# Patient Record
Sex: Female | Born: 1995 | Race: White | Hispanic: No | Marital: Single | Smoking: Current every day smoker
Health system: Southern US, Community
[De-identification: ages and names within clinical notes are randomized; demographics above are authoritative.]

## PROBLEM LIST (undated history)

## (undated) DIAGNOSIS — F419 Anxiety disorder, unspecified: Secondary | ICD-10-CM

## (undated) DIAGNOSIS — S82899A Other fracture of unspecified lower leg, initial encounter for closed fracture: Secondary | ICD-10-CM

## (undated) DIAGNOSIS — F329 Major depressive disorder, single episode, unspecified: Secondary | ICD-10-CM

## (undated) DIAGNOSIS — F32A Depression, unspecified: Secondary | ICD-10-CM

## (undated) HISTORY — DX: Depression, unspecified: F32.A

## (undated) HISTORY — DX: Anxiety disorder, unspecified: F41.9

## (undated) HISTORY — DX: Major depressive disorder, single episode, unspecified: F32.9

## (undated) HISTORY — PX: TONSILLECTOMY: SUR1361

## (undated) HISTORY — PX: OTHER SURGICAL HISTORY: SHX169

## (undated) HISTORY — PX: INNER EAR SURGERY: SHX679

---
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
## 2016-10-06 ENCOUNTER — Ambulatory Visit (INDEPENDENT_AMBULATORY_CARE_PROVIDER_SITE_OTHER): Payer: 59 | Admitting: Physician Assistant

## 2016-10-06 ENCOUNTER — Encounter: Payer: Self-pay | Admitting: Physician Assistant

## 2016-10-06 VITALS — BP 120/73 | HR 97 | Temp 99.1°F | Resp 16 | Ht 61.5 in | Wt 169.2 lb

## 2016-10-06 DIAGNOSIS — F411 Generalized anxiety disorder: Secondary | ICD-10-CM

## 2016-10-06 DIAGNOSIS — R109 Unspecified abdominal pain: Secondary | ICD-10-CM | POA: Diagnosis not present

## 2016-10-06 DIAGNOSIS — G43A0 Cyclical vomiting, not intractable: Secondary | ICD-10-CM

## 2016-10-06 DIAGNOSIS — R112 Nausea with vomiting, unspecified: Secondary | ICD-10-CM | POA: Diagnosis not present

## 2016-10-06 LAB — POCT URINALYSIS DIP (MANUAL ENTRY)
Bilirubin, UA: NEGATIVE
Blood, UA: NEGATIVE
Glucose, UA: NEGATIVE mg/dL
Ketones, POC UA: NEGATIVE mg/dL
Leukocytes, UA: NEGATIVE
Nitrite, UA: NEGATIVE
Protein Ur, POC: NEGATIVE mg/dL
Spec Grav, UA: 1.025 (ref 1.010–1.025)
Urobilinogen, UA: 0.2 E.U./dL
pH, UA: 5.5 (ref 5.0–8.0)

## 2016-10-06 LAB — POCT URINE PREGNANCY: Preg Test, Ur: NEGATIVE

## 2016-10-06 LAB — POCT CBC
Granulocyte percent: 68 %G (ref 37–80)
HCT, POC: 42.6 % (ref 37.7–47.9)
Hemoglobin: 14.4 g/dL (ref 12.2–16.2)
Lymph, poc: 2.8 (ref 0.6–3.4)
MCH, POC: 29 pg (ref 27–31.2)
MCHC: 33.8 g/dL (ref 31.8–35.4)
MCV: 85.9 fL (ref 80–97)
MID (cbc): 0.6 (ref 0–0.9)
MPV: 7.9 fL (ref 0–99.8)
POC Granulocyte: 7.1 — AB (ref 2–6.9)
POC LYMPH PERCENT: 26.5 % (ref 10–50)
POC MID %: 5.5 %M (ref 0–12)
Platelet Count, POC: 429 10*3/uL — AB (ref 142–424)
RBC: 4.96 M/uL (ref 4.04–5.48)
RDW, POC: 12.8 %
WBC: 10.4 10*3/uL — AB (ref 4.6–10.2)

## 2016-10-06 LAB — GLUCOSE, POCT (MANUAL RESULT ENTRY): POC Glucose: 94 mg/dL (ref 70–99)

## 2016-10-06 MED ORDER — ONDANSETRON HCL 4 MG PO TABS: 4.0000 mg | ORAL_TABLET | Freq: Three times a day (TID) | ORAL | 3 refills | Status: DC | PRN

## 2016-10-06 MED ORDER — CAPSAICIN 0.025 % EX CREA: TOPICAL_CREAM | Freq: Every day | CUTANEOUS | 0 refills | Status: DC

## 2016-10-06 NOTE — Progress Notes (Signed)
Alicia Lester  MRN: [id number] DOB: 1995-06-01  PCP: Patient, No Pcp Per  Subjective:  Pt is a 21 year old female who presents to clinic for nausea, vomiting, diarrhea x 4 days. She works as an Cabin [employer].  Nausea and vomiting x 3 days. Vomiting occurs in the morning. She feels nauseous for the rest of the day. 3-4 episodes of diarrhea/day Endorses lower abdominal pain "it always a present pain" and it can get really bad. Abdominal pain x 1 years. No association with food. nausea is worse with laying on her back. She has a few months or so without symptoms, then may vomit daily in the a.m x several days. She has been evaluated by GI multiple times. Flex sig endoscopy was negative. Colonoscopy showed hemorrhoids.  She smokes marijuana every evening. Heat makes nausea worse.  She drinks alcohol occasionally.  She eats mostly boxed meals high in salt, apples. She does not like a lot of vegetables. Will eat corn and peas. Does not consume a lot of sugar. Drinks mostly water, very little soda.   H/o IBS - controlled.  She is taking OCPs. Lives with boyfriend. Has sex without condoms.  2016 went to [place]. Her symptoms started in 2017.  Denies weight loss, mucus in stool, blood in stool, blood in vomit,   H/o anxiety and depression. She is not taking Lexapro on a regular basis x 2 years due to finances.    She still has her gallbladder.   Review of Systems  Constitutional: Negative for chills, diaphoresis, fatigue and fever.  HENT: Negative for congestion, postnasal drip, rhinorrhea, sinus pressure, sneezing and sore throat.   Respiratory: Negative for cough, chest tightness, shortness of breath and wheezing.   Cardiovascular: Negative for chest pain and palpitations.  Gastrointestinal: Positive for abdominal pain, diarrhea, nausea and vomiting. Negative for blood in stool and constipation.  Genitourinary: Negative for decreased urine volume, difficulty urinating, dysuria, enuresis,  flank pain, frequency, hematuria and urgency.  Musculoskeletal: Negative for back pain.  Neurological: Negative for dizziness, weakness, light-headedness and headaches.  Psychiatric/Behavioral: Positive for dysphoric mood. The patient is nervous/anxious.     There are no active problems to display for this patient.   Current Outpatient Prescriptions on File Prior to Visit  Medication Sig Dispense Refill  . escitalopram (LEXAPRO) 10 MG tablet Take 10 mg by mouth daily.    Marland Kitchen amitriptyline (ELAVIL) 25 MG tablet Take 25 mg by mouth at bedtime as needed for sleep.    Marland Kitchen etonogestrel (NEXPLANON) 68 MG IMPL implant 1 each by Subdermal route continuous.    Marland Kitchen FLUoxetine (PROZAC) 10 MG tablet Take 1 tablet (10 mg total) by mouth daily. (Patient not taking: Reported on 05/16/2014) 30 tablet 1  . HYDROcodone-acetaminophen (NORCO/VICODIN) 5-325 MG per tablet Take 1 tablet by mouth every 4 (four) hours as needed for moderate pain or severe pain. (Patient not taking: Reported on 05/16/2014) 11 tablet 0  . ibuprofen (ADVIL,MOTRIN) 600 MG tablet Take 1 tablet (600 mg total) by mouth 3 (three) times daily. (Patient not taking: Reported on 10/06/2016) 30 tablet 0  . loperamide (IMODIUM) 2 MG capsule Take 2 mg by mouth 4 (four) times daily as needed for diarrhea or loose stools.     No current facility-administered medications on file prior to visit.     No Known Allergies   Objective:  BP 120/73   Pulse 97   Temp 99.1 F (37.3 C) (Oral)   Resp 16   Ht 5'  1.5" (1.562 m)   Wt 169 lb 3.2 oz (76.7 kg)   SpO2 95%   BMI 31.45 kg/m   Physical Exam  Constitutional: She is oriented to person, place, and time and well-developed, well-nourished, and in no distress. No distress.  Cardiovascular: Normal rate, regular rhythm and normal heart sounds.   Abdominal: Soft. Normal appearance and bowel sounds are normal. There is tenderness in the right lower quadrant and epigastric area. There is no rigidity, no rebound,  no guarding and no tenderness at McBurney's point.  Neurological: She is alert and oriented to person, place, and time. GCS score is 15.  Skin: Skin is warm and dry.  Psychiatric: Mood, memory, affect and judgment normal.  Vitals reviewed.  Results for orders placed or performed in visit on 10/06/16  POCT urine pregnancy  Result Value Ref Range   Preg Test, Ur Negative Negative  POCT urinalysis dipstick  Result Value Ref Range   Color, UA yellow yellow   Clarity, UA clear clear   Glucose, UA negative negative mg/dL   Bilirubin, UA negative negative   Ketones, POC UA negative negative mg/dL   Spec Grav, UA 1.025 1.010 - 1.025   Blood, UA negative negative   pH, UA 5.5 5.0 - 8.0   Protein Ur, POC negative negative mg/dL   Urobilinogen, UA 0.2 0.2 or 1.0 E.U./dL   Nitrite, UA Negative Negative   Leukocytes, UA Negative Negative  POCT CBC  Result Value Ref Range   WBC 10.4 (A) 4.6 - 10.2 K/uL   Lymph, poc 2.8 0.6 - 3.4   POC LYMPH PERCENT 26.5 10 - 50 %L   MID (cbc) 0.6 0 - 0.9   POC MID % 5.5 0 - 12 %M   POC Granulocyte 7.1 (A) 2 - 6.9   Granulocyte percent 68.0 37 - 80 %G   RBC 4.96 4.04 - 5.48 M/uL   Hemoglobin 14.4 12.2 - 16.2 g/dL   HCT, POC 42.6 37.7 - 47.9 %   MCV 85.9 80 - 97 fL   MCH, POC 29.0 27 - 31.2 pg   MCHC 33.8 31.8 - 35.4 g/dL   RDW, POC 12.8 %   Platelet Count, POC 429 (A) 142 - 424 K/uL   MPV 7.9 0 - 99.8 fL  POCT glucose (manual entry)  Result Value Ref Range   POC Glucose 94 70 - 99 mg/dl    Assessment and Plan :  1. 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 Nausea and vomiting, intractability of vomiting not specified, unspecified vomiting type 3. Abdominal pain, unspecified abdominal location 4. Generalized anxiety disorder - capsaicin (ZOSTRIX) 0.025 % cream; Apply topically daily. Apply a thin film over the abdomen once - may improve acute severe abdominal pain and emesis  Dispense: 60 g; Refill: 0 -  ondansetron (ZOFRAN) 4 MG tablet; Take 1 tablet (4 mg total) by mouth every 8 (eight) hours as needed for nausea or vomiting. 1-2 tablets  Dispense: 30 tablet; Refill: 3 - POCT urine pregnancy - POCT urinalysis dipstick - POCT CBC - POCT glucose (manual entry) - CMP14+EGFR - H. pylori breath test - Pt has interesting HPI. >1 year of cyclical morning vomiting. She has been evaluated by several specialists multiple times, which were negative. Medical records show multiple [facility] admits >2 years ago.  POCT labs are wnl. H Pylori is pending. Suspect GAD vs cannabis hyperemesis syndrome vs GERD vs cyclical vomiting syndrome. Advised pt to start lifestyle changes: start taking  Lexapro daily, take multivitamin, eat several small healthy meals daily, stop smoking marijuana. RTC in 4-6 weeks to check in. Consider Reglan, starting exercise plan, referral to acupuncture.   Mercer Pod, PA-C  [facility] at [place] 10/06/2016 3:46 PM

## 2016-10-06 NOTE — Patient Instructions (Addendum)
There are a plethora of factors that can be causing your cyclical vomiting episodes. Since you have already had extensive work-ups from several specialists, I'd like you to start making changes to your lifestyle.    1) Start taking Lexapro on a regular daily basis. It is important to control your anxiety. Studies show that 20% of patients with cyclical vomiting syndrome have a psychiatric diagnosis (either anxiety, panic anxiety, or another affective disorder).  2) Start taking a B vitamin daily, especially containing B6.   3) Eat healthy foods and drink on a regular basis: Fluids should be consumed at least 30 minutes before or after solid food to minimize the effect of a full stomach. Women with nausea should eat before, or as soon as, they feel hungry to avoid an empty stomach, which can aggravate nausea. A snack before getting out of bed in the morning and snacks during the night (eg, crackers with peanut butter or cheese taken prior to bathroom trips) may be helpful. Meals and snacks should be eaten slowly and in small amounts every one to two hours to avoid an overly full stomach, which can also aggravate nausea for some women. Foods high in sugar may exacerbate symptoms.  4) Stop smoking marijuana for at least 2-3 weeks. There is a condition called cannabis hyperemesis syndrome which is associated with chronic cannabis use.Cessation of prolonged cannabis use has been associated with resolution of episodic vomiting in case reports suggesting a causal link. Many adults with CVS self-medicate with cannabis to alleviate their daily nausea, and this can be a source of diagnostic confusion. At present, the distinction between CVS and cannabis hyperemesis syndrome (R[place] IV criteria) is unclear as there is definite phenotypic overlap between the two conditions.   Come back and see me in 4-6 weeks to check in.   Thank you for coming in today. I hope you feel we met your needs.  Feel free to call PCP if you  have any questions or further requests.  Please consider signing up for MyChart if you do not already have it, as this is a great way to communicate with me.  Best,  [employer], PA-C

## 2016-10-07 ENCOUNTER — Ambulatory Visit: Payer: Self-pay | Admitting: Physician Assistant

## 2016-10-07 ENCOUNTER — Telehealth: Payer: Self-pay | Admitting: Physician Assistant

## 2016-10-07 LAB — CMP14+EGFR
ALT: 17 IU/L (ref 0–32)
AST: 16 IU/L (ref 0–40)
Albumin/Globulin Ratio: 1.6 (ref 1.2–2.2)
Albumin: 4.5 g/dL (ref 3.5–5.5)
Alkaline Phosphatase: 24 IU/L — ABNORMAL LOW (ref 39–117)
BUN/Creatinine Ratio: 14 (ref 9–23)
BUN: 11 mg/dL (ref 6–20)
Bilirubin Total: 0.2 mg/dL (ref 0.0–1.2)
CO2: 22 mmol/L (ref 20–29)
Calcium: 10.2 mg/dL (ref 8.7–10.2)
Chloride: 102 mmol/L (ref 96–106)
Creatinine, Ser: 0.79 mg/dL (ref 0.57–1.00)
GFR calc Af Amer: 125 mL/min/{1.73_m2} (ref >59)
GFR calc non Af Amer: 108 mL/min/{1.73_m2} (ref >59)
Globulin, Total: 2.8 g/dL (ref 1.5–4.5)
Glucose: 105 mg/dL — ABNORMAL HIGH (ref 65–99)
Potassium: 4.6 mmol/L (ref 3.5–5.2)
Sodium: 142 mmol/L (ref 134–144)
Total Protein: 7.3 g/dL (ref 6.0–8.5)

## 2016-10-07 NOTE — Telephone Encounter (Signed)
Pt called stating that  capsaicin (ZOSTRIX) 0.025 % cream   caused skin irritation/ PT was advised to stop using rx and come in due to adverse rx. Pt states she was not experiencing nausea and would hold off on coming in.   Please call to check on pt [phone]

## 2016-10-10 LAB — H. PYLORI BREATH TEST: H pylori Breath Test: NEGATIVE

## 2016-10-10 NOTE — Progress Notes (Signed)
Please call pt and let her know her H Pylori (bacteria in her stomach) is negative. Her liver, kidneys and salts in her blood look great. Please schedule a follow-up appointment with me in 4 weeks.  Thank you!

## 2016-11-09 ENCOUNTER — Encounter: Payer: Self-pay | Admitting: Physician Assistant

## 2016-11-09 ENCOUNTER — Ambulatory Visit (INDEPENDENT_AMBULATORY_CARE_PROVIDER_SITE_OTHER): Payer: 59

## 2016-11-09 ENCOUNTER — Ambulatory Visit (INDEPENDENT_AMBULATORY_CARE_PROVIDER_SITE_OTHER): Payer: 59 | Admitting: Physician Assistant

## 2016-11-09 VITALS — BP 119/73 | HR 95 | Temp 98.9°F | Resp 18 | Ht 61.42 in | Wt 168.2 lb

## 2016-11-09 DIAGNOSIS — R1032 Left lower quadrant pain: Secondary | ICD-10-CM | POA: Diagnosis not present

## 2016-11-09 DIAGNOSIS — Z23 Encounter for immunization: Secondary | ICD-10-CM

## 2016-11-09 DIAGNOSIS — F339 Major depressive disorder, recurrent, unspecified: Secondary | ICD-10-CM | POA: Diagnosis not present

## 2016-11-09 DIAGNOSIS — K5900 Constipation, unspecified: Secondary | ICD-10-CM

## 2016-11-09 MED ORDER — POLYETHYLENE GLYCOL 3350 17 GM/SCOOP PO POWD: 17.0000 g | Freq: Two times a day (BID) | ORAL | 1 refills | Status: DC | PRN

## 2016-11-09 NOTE — Patient Instructions (Addendum)
- Lexapro: 26m x 2 weeks. 510mx 2 weeks. Stop.   For constipation  - Select option 1-3 below to relieve yourself of your stool burden. Then stick with option 1 (or 17 g of powder dissolved in 8 ounces of water once daily and titrate up or down (to a maximum of 34 g daily) to effect.) for the next several weeks. Come back and see me in 4 weeks.    - Get a stool (6-9 inches high) and put it on the floor of your bathroom in front of the toilet. Place your feet on the stool when you plan to have a bowel movement. (google [employer]).  1) Water: Make sure you are drinking enough water daily - about 1-3 liters. 2) Fiber: Make sure you are getting enough fiber in your diet - this will make you regular - you can eat high fiber foods or use metamucil as a supplement - it is really important to drink enough water when using fiber supplements. Foods that have a lot of fiber include vegetables, fruits, beans, nuts, oatmeal, and some breads and cereals. You can tell how much fiber is in a food by reading the nutrition label. Doctors recommend eating 25 to 36 grams of fiber each day. 3) Fitness: Increasing your physical activity will help increase the natural movement of your bowels. Try to get 20-30 minutes of exercise daily.   If your stools are hard or are formed balls or you have to strain a stool softener will help - use colace 2-3 capsule a day  Option 1) For gentle treatment of constipation Use Miralax 1-2 capfuls a day until your stools are soft and regular and then decrease the usage - you can use this daily  Option 2) For more aggressive treatment of constipation Use 4 capfuls of Colace and 6 doses of Miralax and drink it in 2 hours - this should result in several watery stools - if it does not repeat the next day and then go to daily miralax for a week to make sure your bowels are clean and retrained to work properly  Option 3) For the most aggressive treatment of constipation Use 14  capfuls of Miralax in 1 gallon of fluid (gatoraid or water work well or a combination of the two) and drink over 12h - it is ok to eat during this time and then use Miralax 1 capful daily for about 2 weeks to prevent the constipation from returning  Thank you for coming in today. I hope you feel we met your needs.  Feel free to call PCP if you have any questions or further requests.  Please consider signing up for MyChart if you do not already have it, as this is a great way to communicate with me.  Best,  Whitney McVey, PA-C   IF you received an x-ray today, you will receive an invoice from [facility]. Please contact [facility] at [phone] questions or concerns regarding your invoice.   IF you received labwork today, you will receive an invoice from [facility] at [phone] questions or concerns regarding your invoice.   Our billing staff will not be able to assist you with questions regarding bills from these companies.  You will be contacted with the lab results as soon as they are available. The fastest way to get your results is to activate your My Chart account. Instructions are located on the last page of this paperwork. If you  have not heard from [place] regarding the results in 2 weeks, please contact this office.

## 2016-11-09 NOTE — Progress Notes (Signed)
Alicia CostainSavannah Lester  MRN: [id number] DOB: August 28, 1995  PCP: Patient, No Pcp Per  Subjective:  Pt is a 21 year old female who presents to clinic for f/u abdominal pain.  Endorses nausea and diarrhea x 1 day. No blood, fever or chills.   She was here 7/26 for the same c/c. Plan: Pt has interesting HPI. >1 year of cyclical morning vomiting. She has been evaluated by several specialists multiple times, which were negative. [facility].  POCT labs are wnl. H Pylori is pending. Suspect GAD vs cannabis hyperemesis syndrome vs GERD vs cyclical vomiting syndrome. Advised pt to start lifestyle changes: start taking Lexapro daily, take multivitamin, eat several small healthy meals daily, stop smoking marijuana. RTC in 4-6 weeks to check in. Consider Reglan, starting exercise plan, referral to acupuncture.  Today she c/o abdominal pain of her lower stomach "sharp pain goes through my stomach" and gets dizzy.  She stopped smoking marijuana x 2 weeks. Did not help her nausea.  She is now eating chicken or Mal[place]key rather than beef or pork. She has not noticed a difference in how she feels.   H/o IBS - controlled.   C/o diarrhea today. 3 episodes today. Denies blood in her stool. No BM yesterday. Endorses having to strain with bowel movements.   She started Lexapro last month. "I don't think the Lexapro does anything for me".  Denies SI or HI.   Review of Systems  Constitutional: Negative for chills, diaphoresis, fatigue and fever.  Cardiovascular: Negative for chest pain and palpitations.  Gastrointestinal: Positive for abdominal pain, constipation, diarrhea and nausea. Negative for abdominal distention, blood in stool and vomiting.  Genitourinary: Negative for decreased urine volume, difficulty urinating, dysuria, enuresis, flank pain, frequency, hematuria, menstrual problem, pelvic pain, urgency, vaginal bleeding, vaginal discharge and vaginal pain.    Musculoskeletal: Negative for back pain.  Neurological: Negative for dizziness, weakness, light-headedness and headaches.    There are no active problems to display for this patient.   Current Outpatient Prescriptions on File Prior to Visit  Medication Sig Dispense Refill  . escitalopram (LEXAPRO) 10 MG tablet Take 10 mg by mouth daily.    . metFORMIN (GLUCOPHAGE) 500 MG tablet Take by mouth 2 (two) times daily with a meal.    . ondansetron (ZOFRAN) 4 MG tablet Take 1 tablet (4 mg total) by mouth every 8 (eight) hours as needed for nausea or vomiting. 1-2 tablets 30 tablet 3  . PRESCRIPTION MEDICATION     . PRESCRIPTION MEDICATION Birth control pill    . capsaicin (ZOSTRIX) 0.025 % cream Apply topically daily. Apply a thin film over the abdomen once - may improve acute severe abdominal pain and emesis (Patient not taking: Reported on 11/09/2016) 60 g 0   No current facility-administered medications on file prior to visit.     No Known Allergies   Objective:  BP 119/73 (BP Location: Right Arm, Patient Position: Sitting, Cuff Size: Normal)   Pulse 95   Temp 98.9 F (37.2 C) (Oral)   Resp 18   Ht 5' 1.42" (1.56 m)   Wt 168 lb 3.2 oz (76.3 kg)   LMP 11/01/2016 (Approximate)   SpO2 97%   BMI 31.35 kg/m   Physical Exam  Constitutional: She is oriented to person, place, and time and well-developed, well-nourished, and in no distress. No distress.  Cardiovascular: Normal rate, regular rhythm and normal heart sounds.   Abdominal: Soft. Normal appearance and bowel sounds  are normal. There is no hepatosplenomegaly. There is tenderness in the right lower quadrant, periumbilical area and left lower quadrant. There is no rigidity, no rebound, no guarding, no CVA tenderness, no tenderness at McBurney's point and negative Murphy's sign.  Neurological: She is alert and oriented to person, place, and time. GCS score is 15.  Skin: Skin is warm and dry.  Psychiatric: Mood, memory, affect and  judgment normal.  Vitals reviewed.  Dg Abd 2 Views  Result Date: 11/09/2016 CLINICAL DATA:  Left lower quadrant pain for several months. EXAM: ABDOMEN - 2 VIEW COMPARISON:  None. FINDINGS: The bowel gas pattern is normal. There is no evidence of free air. 3 mm calcification in the interpolar aspect of the left renal shadow may represent nephrolithiasis. No acute nor suspicious osseous abnormalities. Mild levoconvex curvature of the lumbar spine, apex at L2. IMPRESSION: 1. Unremarkable bowel gas pattern. 2. 3 mm calcification projects over the mid left renal shadow suspicious for nephrolithiasis. 3. Mild levoconvex curvature of the lumbar spine. Electronically Signed   By: Tollie Eth M.D.   On: 11/09/2016 15:55    Assessment and Plan :  1. Constipation, unspecified constipation type 2. Left lower quadrant pain - polyethylene glycol powder (GLYCOLAX/MIRALAX) powder; Take 17 g by mouth 2 (two) times daily as needed.  Dispense: 578 g; Refill: 1 - DG Abd 2 Views; Future - Suspicious for constipation. X-ray shows possible nephrolithiasis, however her HPI and PE does not fit this picture. Will monitor. Plan to treat with Mirilax and RTC in 3-4 weeks for f/u.  3. Recurrent major depressive disorder, remission status unspecified (HCC) - Taper down from Lexapro. Pt states this medication "doesn't do anything for me". Will start alternative at her next appt in 4 weeks. RTC sooner if symptoms worsen.   4. Need for prophylactic vaccination and inoculation against influenza - Flu Vaccine QUAD 36+ mos IM  Marco Collie, PA-C  [facility] at [facility] 11/09/2016 2:28 PM

## 2016-11-12 ENCOUNTER — Telehealth: Payer: Self-pay | Admitting: Physician Assistant

## 2016-11-12 NOTE — Telephone Encounter (Signed)
Pt called stating that she is still having bad lower back pain and last night there was blood in her bowel movement.  She also had xrays done and nobody has advised her of her results.  Please advise with further instruction

## 2016-11-15 NOTE — Telephone Encounter (Signed)
She was advised to RTC in 3 weeks for follow-up of her constipation. She is supposed to be using Miralax daily, make sure she is using this daily as directed.  She should RTC sooner if her symptoms are worsening.  Thank you!

## 2016-11-15 NOTE — Telephone Encounter (Signed)
Do you want them to come in for an OV ?

## 2016-11-21 NOTE — Telephone Encounter (Signed)
Pt advised. Pt still complaining of constipation. Advised to continue with miralax daily and to add colace to regimen per previous recommendations.

## 2016-12-14 ENCOUNTER — Ambulatory Visit: Payer: 59 | Admitting: Physician Assistant

## 2016-12-26 ENCOUNTER — Emergency Department (HOSPITAL_COMMUNITY)
Admission: EM | Admit: 2016-12-26 | Discharge: 2016-12-26 | Disposition: A | Discharge status: Home / Self Care | Payer: Self-pay | Attending: Emergency Medicine | Admitting: Emergency Medicine

## 2016-12-26 ENCOUNTER — Encounter (HOSPITAL_COMMUNITY): Payer: Self-pay | Admitting: Emergency Medicine

## 2016-12-26 DIAGNOSIS — R0989 Other specified symptoms and signs involving the circulatory and respiratory systems: Secondary | ICD-10-CM | POA: Insufficient documentation

## 2016-12-26 DIAGNOSIS — Z7984 Long term (current) use of oral hypoglycemic drugs: Secondary | ICD-10-CM | POA: Insufficient documentation

## 2016-12-26 DIAGNOSIS — F17210 Nicotine dependence, cigarettes, uncomplicated: Secondary | ICD-10-CM | POA: Insufficient documentation

## 2016-12-26 DIAGNOSIS — Z79899 Other long term (current) drug therapy: Secondary | ICD-10-CM | POA: Insufficient documentation

## 2016-12-26 NOTE — Discharge Instructions (Signed)
I suspect that your symptoms are from irritation or minor injury to your esophagus. I feel it is significantly less likely that you have something stuck in your throat. Recommend a soft diet and liquids while symptomatic and returning to typical diet as symptoms improve. Return to the emergency room if you're unable to swallow or have problems with breathing. I suspect that this will resolve by itself in the next few days.

## 2016-12-26 NOTE — ED Triage Notes (Signed)
Pt was eating french fries and feels like there is one struck in her throat. Pt reports no difficulty breathing or swallowing.

## 2016-12-28 NOTE — ED Provider Notes (Signed)
[facility] Provider Note   CSN: [id number] Arrival date & time: 12/26/16  1303     History   Chief Complaint Chief Complaint  Patient presents with  . Sore Throat    HPI Alicia Lester is a 21 y.o. female.  HPI   21 year old female with foreign body sensation in her throat. Happened while she was eating french fries earlier today. Persistent since then. She has been able to swallow both solids and liquids although she has increased pain when she does this. No shortness of breath. No cough. No change in voice. No drooling.  Past Medical History:  Diagnosis Date  . Ankle fracture   . Anxiety   . Depression     There are no active problems to display for this patient.   Past Surgical History:  Procedure Laterality Date  . adinoid    . extraction of wisdom teeth    . INNER EAR SURGERY     tubes  . TONSILLECTOMY      OB History    No data available       Home Medications    Prior to Admission medications   Medication Sig Start Date End Date Taking? Authorizing Provider  capsaicin (ZOSTRIX) 0.025 % cream Apply topically daily. Apply a thin film over the abdomen once - may improve acute severe abdominal pain and emesis Patient not taking: Reported on 11/09/2016 10/06/16   McVey, Madelaine BhatElizabeth Whitney, PA-C  escitalopram (LEXAPRO) 10 MG tablet Take 10 mg by mouth daily.    [provider]  metFORMIN (GLUCOPHAGE) 500 MG tablet Take by mouth 2 (two) times daily with a meal.    [provider]  ondansetron (ZOFRAN) 4 MG tablet Take 1 tablet (4 mg total) by mouth every 8 (eight) hours as needed for nausea or vomiting. 1-2 tablets 10/06/16   McVey, Madelaine BhatElizabeth Whitney, PA-C  polyethylene glycol powder (GLYCOLAX/MIRALAX) powder Take 17 g by mouth 2 (two) times daily as needed. 11/09/16   McVey, Madelaine BhatElizabeth Whitney, PA-C  PRESCRIPTION MEDICATION     [provider]  PRESCRIPTION MEDICATION Birth control pill     [provider]    Family History Family History  Problem Relation Age of Onset  . Hypertension Other   . Diabetes Other   . Mental illness Mother     Social History Social History  Substance Use Topics  . Smoking status: Current Every Day Smoker    Types: Cigarettes  . Smokeless tobacco: Never Used  . Alcohol use No     Allergies   Patient has no known allergies.   Review of Systems Review of Systems   All systems reviewed and negative, other than as noted in HPI.   Leslie Dales.reviuew Physical Exam Updated Vital Signs BP 120/70 (BP Location: Right Arm)   Pulse 82   Temp 98 F (36.7 C) (Oral)   Resp 16   SpO2 100%   Physical Exam  Constitutional: She appears well-developed and well-nourished. No distress.  HENT:  Head: Normocephalic and atraumatic.  Oropharynx clear. Normal sounding voice. Handling secretions. Neck is supple. No stridor.  Eyes: Conjunctivae are normal. Right eye exhibits no discharge. Left eye exhibits no discharge.  Neck: Neck supple.  Cardiovascular: Normal rate, regular rhythm and normal heart sounds.  Exam reveals no gallop and no friction rub.   No murmur heard. Pulmonary/Chest: Effort normal and breath sounds normal. No respiratory distress.  Abdominal: Soft. She exhibits no distension. There is no tenderness.  Musculoskeletal: She exhibits no edema or tenderness.  Neurological: She is alert.  Skin: Skin is warm and dry.  Psychiatric: She has a normal mood and affect. Her behavior is normal. Thought content normal.  Nursing note and vitals reviewed.    ED Treatments / Results  Labs (all labs ordered are listed, but only abnormal results are displayed) Labs Reviewed - No data to display  EKG  EKG Interpretation None       Radiology No results found.  Procedures Procedures (including critical care time)  Medications Ordered in ED Medications - No data to display   Initial Impression / Assessment and Plan / ED Course   I have reviewed the triage vital signs and the nursing notes.  Pertinent labs & imaging results that were available during my care of the patient were reviewed by me and considered in my medical decision making (see chart for details).     21 year old female with esophageal foreign body sensation. Suspect from esophageal irritation. Exam is unremarkable. She has been eating and drinking without difficulty. Reassurance provided and return cautions discussed.  Final Clinical Impressions(s) / ED Diagnoses   Final diagnoses:  Sensation of foreign body in throat    New Prescriptions Discharge Medication List as of 12/26/2016  2:39 PM       Raeford Razor, MD 12/28/16 1243

## 2017-07-25 ENCOUNTER — Ambulatory Visit: Payer: 59 | Admitting: Physician Assistant

## 2017-09-27 ENCOUNTER — Encounter: Payer: Self-pay | Admitting: Physician Assistant

## 2017-09-27 ENCOUNTER — Other Ambulatory Visit: Payer: Self-pay

## 2017-09-27 ENCOUNTER — Ambulatory Visit: Payer: 59 | Admitting: Emergency Medicine

## 2017-09-27 ENCOUNTER — Ambulatory Visit (INDEPENDENT_AMBULATORY_CARE_PROVIDER_SITE_OTHER): Payer: BLUE CROSS/BLUE SHIELD | Admitting: Physician Assistant

## 2017-09-27 VITALS — BP 123/86 | HR 97 | Temp 98.6°F | Ht 62.0 in | Wt 184.8 lb

## 2017-09-27 DIAGNOSIS — Z789 Other specified health status: Secondary | ICD-10-CM | POA: Diagnosis not present

## 2017-09-27 DIAGNOSIS — R238 Other skin changes: Secondary | ICD-10-CM | POA: Diagnosis not present

## 2017-09-27 NOTE — Progress Notes (Signed)
10/04/2017 11:03 AM   DOB: 08-May-1995 / MRN: [id number]  SUBJECTIVE:  Alicia Lester is a 22 y.o. female presenting for "bump down there." She just noticed it a few days ago and states it is painless and does not itch. She has not tried anything for her symptoms. It is not worsening nor improving.   She has No Known Allergies.   She  has a past medical history of Ankle fracture, Anxiety, and Depression.    She  reports that she has been smoking cigarettes.  She has never used smokeless tobacco. She reports that she has current or past drug history. Drug: Marijuana. She reports that she does not drink alcohol. She  reports that she currently engages in sexual activity. The patient  has a past surgical history that includes Tonsillectomy; adinoid; Inner ear surgery; and extraction of wisdom teeth.  Her family history includes Diabetes in her other; Hypertension in her other; Mental illness in her mother.  Review of Systems  Constitutional: Negative for chills, diaphoresis and fever.  Respiratory: Negative for cough, hemoptysis, sputum production, shortness of breath and wheezing.   Cardiovascular: Negative for chest pain, orthopnea and leg swelling.  Gastrointestinal: Negative for abdominal pain, blood in stool, constipation, diarrhea, heartburn, melena, nausea and vomiting.  Genitourinary: Negative for dysuria, flank pain, frequency, hematuria and urgency.  Skin: Negative for rash.  Neurological: Negative for dizziness.    The problem list and medications were reviewed and updated by myself where necessary and exist elsewhere in the encounter.   OBJECTIVE:  BP 123/86 (BP Location: Left Arm, Patient Position: Sitting, Cuff Size: Normal)   Pulse 97   Temp 98.6 F (37 C) (Oral)   Ht 5\' 2"  (1.575 m)   Wt 184 lb 12.8 oz (83.8 kg)   LMP 09/15/2017   SpO2 99%   BMI 33.80 kg/m   Wt Readings from Last 3 Encounters:  09/27/17 184 lb 12.8 oz (83.8 kg)  11/09/16 168 lb 3.2 oz (76.3  kg)  10/06/16 169 lb 3.2 oz (76.7 kg)   Temp Readings from Last 3 Encounters:  09/27/17 98.6 F (37 C) (Oral)  12/26/16 98 F (36.7 C) (Oral)  11/09/16 98.9 F (37.2 C) (Oral)   BP Readings from Last 3 Encounters:  09/27/17 123/86  12/26/16 120/70  11/09/16 119/73   Pulse Readings from Last 3 Encounters:  09/27/17 97  12/26/16 82  11/09/16 95    Physical Exam  Constitutional: She is oriented to person, place, and time. She appears well-developed.  Eyes: Pupils are equal, round, and reactive to light. EOM are normal.  Cardiovascular: Normal rate.  Pulmonary/Chest: Effort normal.  Abdominal: She exhibits no distension.  Genitourinary:     Musculoskeletal: Normal range of motion.  Neurological: She is alert and oriented to person, place, and time. No cranial nerve deficit.  Skin: Skin is warm and dry. She is not diaphoretic.  Psychiatric: Her mood appears anxious.  Vitals reviewed.  Lab Results  Component Value Date   WBC 10.4 (A) 10/06/2016   HGB 14.4 10/06/2016   HCT 42.6 10/06/2016   MCV 85.9 10/06/2016   PLT 392 07/20/2012    Lab Results  Component Value Date   CREATININE 0.79 10/06/2016   BUN 11 10/06/2016   NA 142 10/06/2016   K 4.6 10/06/2016   CL 102 10/06/2016   CO2 22 10/06/2016    Lab Results  Component Value Date   ALT 17 10/06/2016   AST 16 10/06/2016  ALKPHOS 24 (L) 10/06/2016   BILITOT 0.2 10/06/2016    No results found for: TSH  No results found for: CHOL, HDL, LDLCALC, LDLDIRECT, TRIG, CHOLHDL   ASSESSMENT AND PLAN:  Alicia Lester was seen today for cyst.  Diagnoses and all orders for this visit:  Benign papule Comments: We agree on watchful waiting as her exam is unconcerning to me.  She feels better with reassurance.  Follow up as needed.   Normal appearance of tissue    The patient is advised to call or return to clinic if she does not see an improvement in symptoms, or to seek the care of the closest emergency  department if she worsens with the above plan.   Alicia Lester, MHS, PA-C [facility] at [facility] 10/04/2017 11:03 AM

## 2017-09-27 NOTE — Patient Instructions (Addendum)
I don't see anything worrisome on your exam. Please come back in if things are changing.     IF you received an x-ray today, you will [facility]. [facility] at [phone] with questions or concerns regarding your invoice.   IF you received labwork today, you will receive an invoice from [facility]. Please contact [facility] at [phone] with questions or concerns regarding your invoice.   Our billing staff will not be able to assist you with questions regarding bills from these companies.  You will be contacted with the lab results as soon as they are available. The fastest way to get your results is to activate your My Chart account. Instructions are located on the last page of this paperwork. If you have not heard from us regarding the results in 2 weeks, please contact this office.            IF you received an x-ray today, you will receive an invoice from [facility]. Please contact [facility] at [phone] with questions or concerns regarding your invoice.   IF you received labwork today, you will receive an invoice from [facility]. Please contact [facility] at [phone] with questions or concerns regarding your invoice.   Our billing staff will not be able to assist you with questions regarding bills from these companies.  You will be contacted with the lab results as soon as they are available. The fastest way to get your results is to activate your My Chart account. Instructions are located on the last page of this paperwork. If you have not heard from us regarding the results in 2 weeks, please contact this office.

## 2018-01-25 IMAGING — DX DG ABDOMEN 2V
3 series · 3 of 3 positions shown · non-contrast
Comparison: None.

CLINICAL DATA: Left lower quadrant pain for several months.

EXAM:
ABDOMEN - 2 VIEW

[abdomen erect]
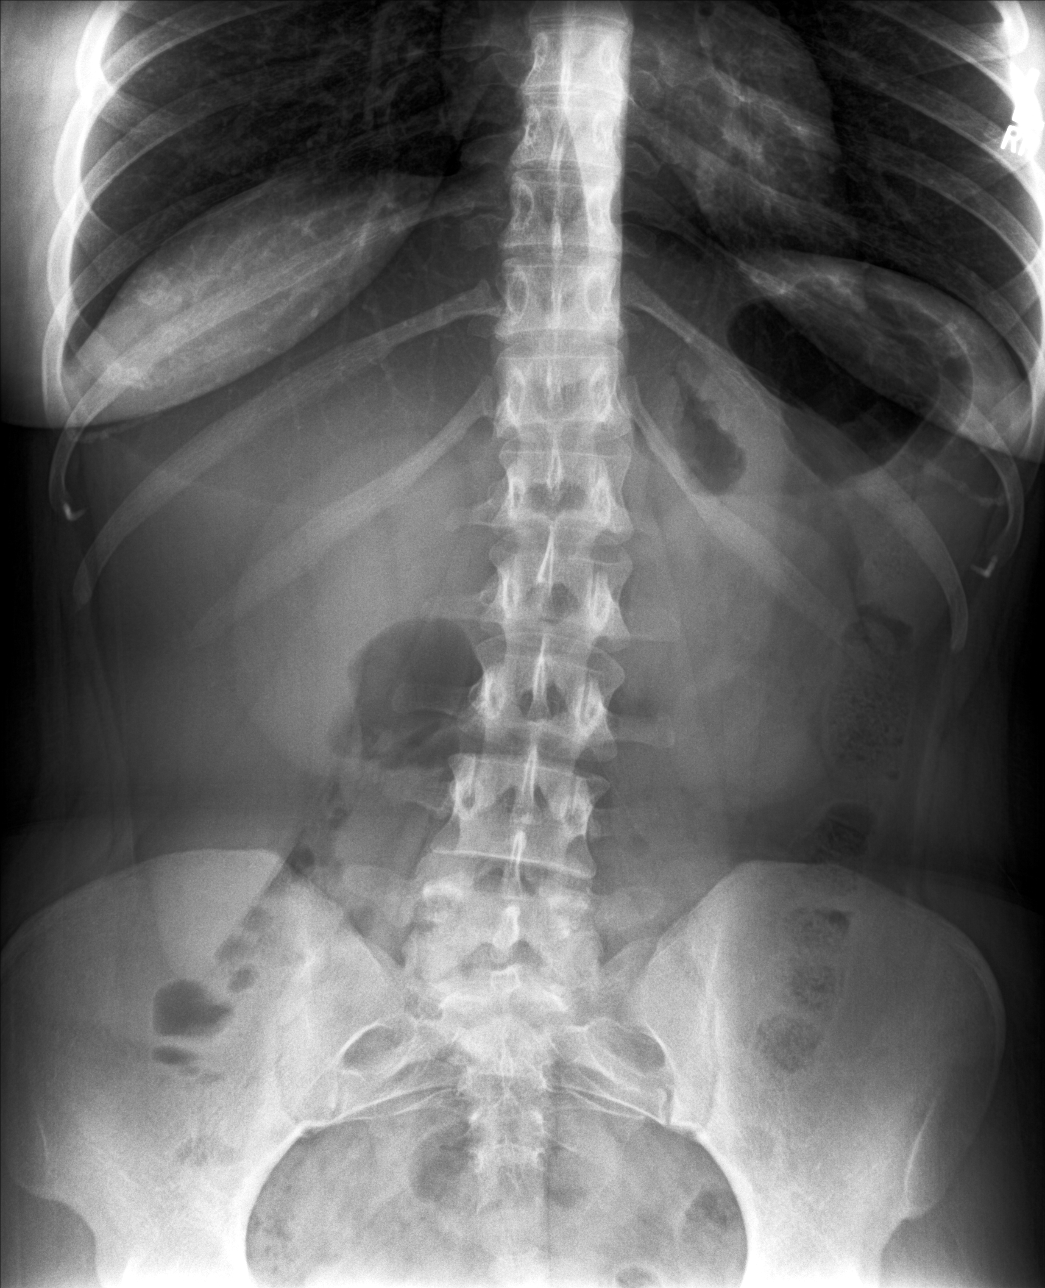

[abdomen supine (1 of 2)]
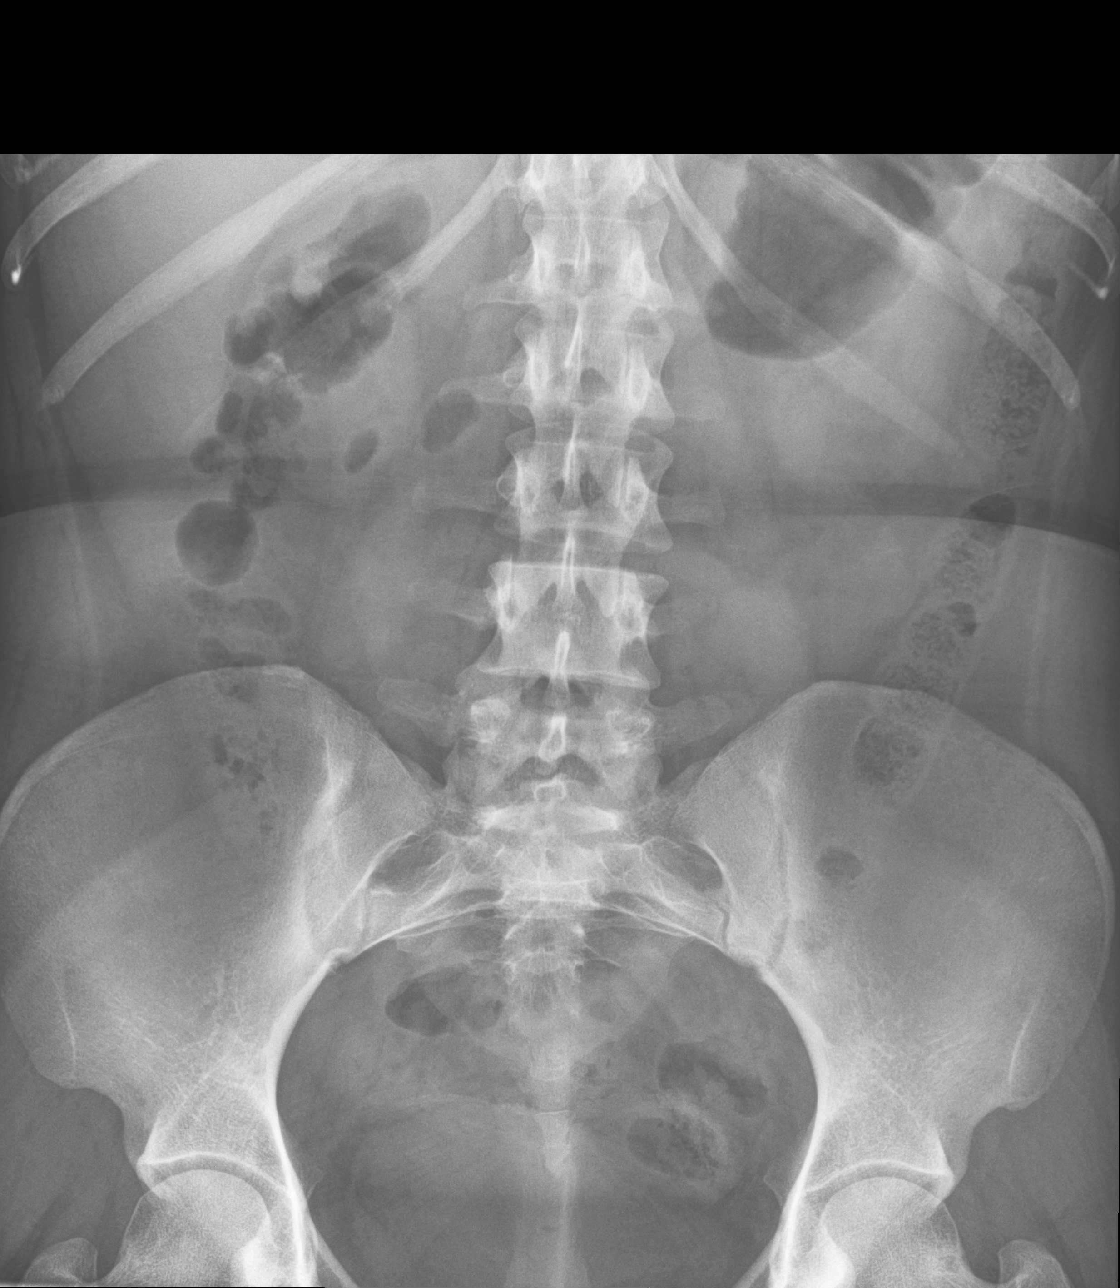

[abdomen supine (2 of 2)]
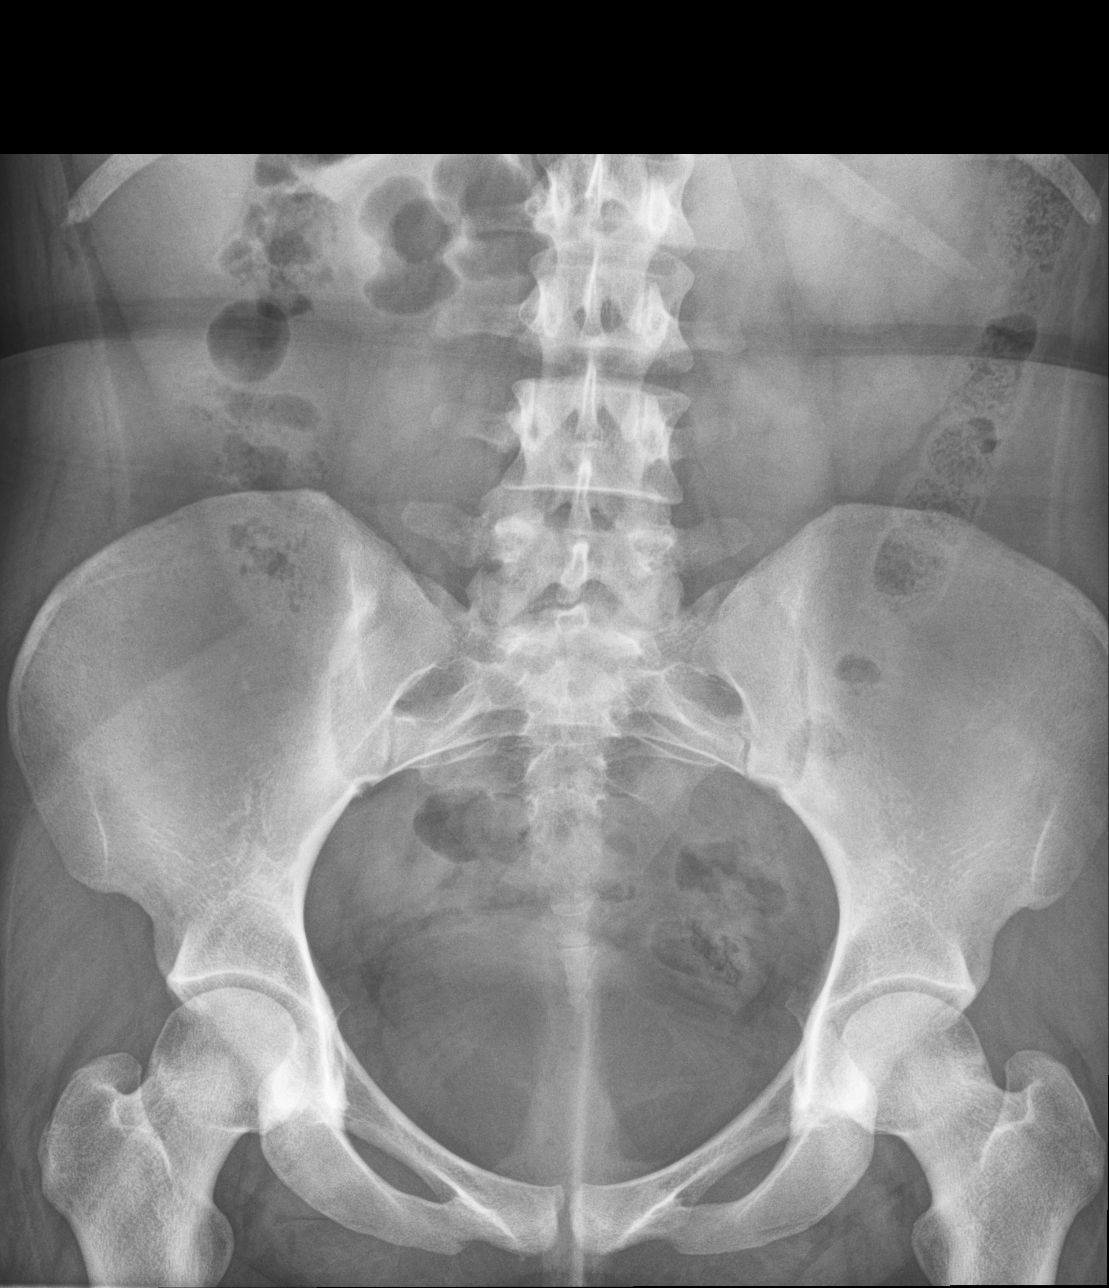

[3 of 3 positions shown; findings below may reference images not displayed]

FINDINGS: The bowel gas pattern is normal. There is no evidence of free air. 3
mm calcification in the interpolar aspect of the left renal shadow
may represent nephrolithiasis. No acute nor suspicious osseous
abnormalities. Mild levoconvex curvature of the lumbar spine, apex
at L2.
IMPRESSION: 1. Unremarkable bowel gas pattern.
2. 3 mm calcification projects over the mid left renal shadow
suspicious for nephrolithiasis.
3. Mild levoconvex curvature of the lumbar spine.
